# Patient Record
Sex: Female | Born: 1980 | Race: Black or African American | Marital: Married | Smoking: Current some day smoker
Health system: Southern US, Community
[De-identification: ages and names within clinical notes are randomized; demographics above are authoritative.]

## PROBLEM LIST (undated history)

## (undated) DIAGNOSIS — J45909 Unspecified asthma, uncomplicated: Secondary | ICD-10-CM

## (undated) DIAGNOSIS — E119 Type 2 diabetes mellitus without complications: Secondary | ICD-10-CM

## (undated) DIAGNOSIS — D649 Anemia, unspecified: Secondary | ICD-10-CM

## (undated) DIAGNOSIS — E7800 Pure hypercholesterolemia, unspecified: Secondary | ICD-10-CM

## (undated) DIAGNOSIS — I10 Essential (primary) hypertension: Secondary | ICD-10-CM

## (undated) HISTORY — DX: Type 2 diabetes mellitus without complications: E11.9

## (undated) HISTORY — DX: Anemia, unspecified: D64.9

## (undated) HISTORY — DX: Unspecified asthma, uncomplicated: J45.909

## (undated) HISTORY — DX: Essential (primary) hypertension: I10

## (undated) HISTORY — DX: Pure hypercholesterolemia, unspecified: E78.00

---

## 2018-10-05 ENCOUNTER — Ambulatory Visit: Payer: Medicaid Other | Admitting: Obstetrics and Gynecology

## 2018-10-18 ENCOUNTER — Encounter: Payer: Self-pay | Admitting: Obstetrics and Gynecology

## 2018-11-18 ENCOUNTER — Other Ambulatory Visit: Payer: Self-pay | Admitting: Physician Assistant

## 2018-11-18 ENCOUNTER — Ambulatory Visit
Admission: RE | Admit: 2018-11-18 | Discharge: 2018-11-18 | Disposition: A | Discharge status: Home / Self Care | Payer: Medicaid Other | Source: Ambulatory Visit | Attending: Physician Assistant | Admitting: Physician Assistant

## 2018-11-18 DIAGNOSIS — M25561 Pain in right knee: Secondary | ICD-10-CM

## 2018-11-18 DIAGNOSIS — M25562 Pain in left knee: Secondary | ICD-10-CM

## 2018-11-18 DIAGNOSIS — M542 Cervicalgia: Secondary | ICD-10-CM

## 2018-12-28 ENCOUNTER — Encounter: Payer: Self-pay | Admitting: Advanced Practice Midwife

## 2018-12-28 ENCOUNTER — Ambulatory Visit: Payer: Medicaid Other | Admitting: Advanced Practice Midwife

## 2018-12-28 ENCOUNTER — Other Ambulatory Visit (HOSPITAL_COMMUNITY)
Admission: RE | Admit: 2018-12-28 | Discharge: 2018-12-28 | Disposition: A | Discharge status: Home / Self Care | Payer: Medicaid Other | Source: Ambulatory Visit | Attending: Advanced Practice Midwife | Admitting: Advanced Practice Midwife

## 2018-12-28 VITALS — BP 126/75 | HR 102 | Ht 66.0 in | Wt 217.0 lb

## 2018-12-28 DIAGNOSIS — N939 Abnormal uterine and vaginal bleeding, unspecified: Secondary | ICD-10-CM

## 2018-12-28 DIAGNOSIS — N96 Recurrent pregnancy loss: Secondary | ICD-10-CM | POA: Diagnosis not present

## 2018-12-28 DIAGNOSIS — R102 Pelvic and perineal pain unspecified side: Secondary | ICD-10-CM

## 2018-12-28 DIAGNOSIS — Z01419 Encounter for gynecological examination (general) (routine) without abnormal findings: Secondary | ICD-10-CM | POA: Diagnosis not present

## 2018-12-28 DIAGNOSIS — N941 Unspecified dyspareunia: Secondary | ICD-10-CM

## 2018-12-28 DIAGNOSIS — Z8759 Personal history of other complications of pregnancy, childbirth and the puerperium: Secondary | ICD-10-CM

## 2018-12-28 MED ORDER — MEGESTROL ACETATE 40 MG PO TABS: 40.0000 mg | ORAL_TABLET | Freq: Two times a day (BID) | ORAL | 3 refills | Status: AC

## 2018-12-28 NOTE — Patient Instructions (Signed)
Abnormal Uterine Bleeding  Abnormal uterine bleeding is unusual bleeding from the uterus. It includes:   Bleeding or spotting between periods.   Bleeding after sex.   Bleeding that is heavier than normal.   Periods that last longer than usual.   Bleeding after menopause.  Abnormal uterine bleeding can affect women at various stages in life, including teenagers, women in their reproductive years, pregnant women, and women who have reached menopause. Common causes of abnormal uterine bleeding include:   Pregnancy.   Growths of tissue (polyps).   A noncancerous tumor in the uterus (fibroid).   Infection.   Cancer.   Hormonal imbalances.  Any type of abnormal bleeding should be evaluated by a health care provider. Many cases are minor and simple to treat, while others are more serious. Treatment will depend on the cause of the bleeding.  Follow these instructions at home:   Monitor your condition for any changes.   Do not use tampons, douche, or have sex if told by your health care provider.   Change your pads often.   Get regular exams that include pelvic exams and cervical cancer screening.   Keep all follow-up visits as told by your health care provider. This is important.  Contact a health care provider if:   Your bleeding lasts for more than one week.   You feel dizzy at times.   You feel nauseous or you vomit.  Get help right away if:   You pass out.   Your bleeding soaks through a pad every hour.   You have abdominal pain.   You have a fever.   You become sweaty or weak.   You pass large blood clots from your vagina.  Summary   Abnormal uterine bleeding is unusual bleeding from the uterus.   Any type of abnormal bleeding should be evaluated by a health care provider. Many cases are minor and simple to treat, while others are more serious.   Treatment will depend on the cause of the bleeding.  This information is not intended to replace advice given to you by your health care provider.  Make sure you discuss any questions you have with your health care provider.  Document Released: 10/13/2005 Document Revised: 11/14/2016 Document Reviewed: 11/14/2016  Elsevier Interactive Patient Education  2019 [employer]

## 2018-12-28 NOTE — Progress Notes (Signed)
  GYNECOLOGY PROGRESS NOTE  History:  38 y.o. G6P0060 with poor obstetric hx including ectopic x 3, SAB x 2 and stillbirth x 1 presents to [facility] office today for problem gyn visit. She reports onset of irregular heavy bleeding 3 years ago, worsening in recent months.  She also has dyspareunia with onset 3 years ago.  She has seen primary care and OB/Gyn but was unhappy with her care in another [place] and plans to stay here in [place].  She and her husband desire pregnancy.  She is not sure if she has ever had evaluation for frequent pregnancy loss.  She was recently told she was anemic and started on oral iron which she is taking as prescribed.  She reports the bleeding occurs irregularly every 1-4 months and lasts 2-3 weeks with large clots.  She has occasional dizziness and h/a and reports fatigue. She denies chest pain or shortness of breath.       The following portions of the patient's history were reviewed and updated as appropriate: allergies, current medications, past family history, past medical history, past social history, past surgical history and problem list. Pt is unsure about last pap smear date but reports it was normal.  Review of Systems:  Pertinent items are noted in HPI.   Objective:  Physical Exam Blood pressure 126/75, pulse (!) 102, height 5\' 6"  (1.676 m), weight 98.4 kg, last menstrual period 12/08/2018. VS reviewed, nursing note reviewed,  Constitutional: well developed, well nourished, no distress HEENT: normocephalic CV: normal rate Pulm/chest wall: normal effort VS reviewed, nursing note reviewed,  Constitutional: well developed, well nourished, no distress HEENT: normocephalic CV: normal rate Pulm/chest wall: normal effort Breast Exam:  right breast normal without mass, skin or nipple changes or axillary nodes, left breast normal without mass, skin or nipple changes or axillary nodes Abdomen: soft, tenderness bilaterally in mid abdomen/flank, no rebound  tenderness or guarding Neuro: alert and oriented x 3 Skin: warm, dry Psych: affect normal Pelvic exam: Cervix pink, visually closed, without lesion, small amount of dark red bleeding with small clots, no fox swab required to visualize cervix, vaginal walls and external genitalia normal Bimanual exam: Cervix 0/long/high, firm, anterior, neg CMT, uterus nontender, nonenlarged, adnexa with tenderness bilaterally, no enlargement or mass  Assessment & Plan:  1. Pelvic pain in female --Differentials include fibroids, endometriosis, adenomyosis, infection - Cervicovaginal ancillary only( [place])  2. Abnormal uterine bleeding (AUB) --No records to review, will repeat CBC for baseline today - CBC --Megace 40 mg BID x 1 month with 2 refills.    3. Encounter for well woman exam with routine gynecological exam - Cytology - PAP( [place])  4. History of multiple miscarriages --Poor obstetric hx with ectopics, SABs, and demise --Consult Dr Debroah Loop.  Labwork today, outpatient [place], and pt to see MD for follow up in 1-2 months. - Cardiolipin antibodies, IgG, IgM, IgA - Lupus anticoagulant panel( [facility]) - Von Willebrand panel   Sharen Counter, CNM 12:45 PM

## 2018-12-30 LAB — CBC
Hematocrit: 35.2 % (ref 34.0–46.6)
Hemoglobin: 10.2 g/dL — ABNORMAL LOW (ref 11.1–15.9)
MCH: 18.4 pg — AB (ref 26.6–33.0)
MCHC: 29 g/dL — AB (ref 31.5–35.7)
MCV: 64 fL — ABNORMAL LOW (ref 79–97)
NRBC: 1 % — AB (ref 0–0)
Platelets: 345 10*3/uL (ref 150–450)
RBC: 5.53 x10E6/uL — AB (ref 3.77–5.28)
RDW: 24 % — ABNORMAL HIGH (ref 11.7–15.4)
WBC: 9.8 10*3/uL (ref 3.4–10.8)

## 2018-12-30 LAB — VON WILLEBRAND PANEL
Factor VIII Activity: 251 % — ABNORMAL HIGH (ref 56–140)
Von Willebrand Ag: 315 % — ABNORMAL HIGH (ref 50–200)
Von Willebrand Factor: 268 % — ABNORMAL HIGH (ref 50–200)

## 2018-12-30 LAB — CARDIOLIPIN ANTIBODIES, IGG, IGM, IGA
Anticardiolipin IgA: <9 APL U/mL (ref 0–11)
Anticardiolipin IgG: <9 GPL U/mL (ref 0–14)
Anticardiolipin IgM: <9 MPL U/mL (ref 0–12)

## 2018-12-30 LAB — COAG STUDIES INTERP REPORT

## 2018-12-30 LAB — LUPUS ANTICOAGULANT PANEL
Dilute Viper Venom Time: 29.8 s (ref 0.0–47.0)
PTT Lupus Anticoagulant: 35.8 s (ref 0.0–51.9)

## 2018-12-30 LAB — CYTOLOGY - PAP
Diagnosis: NEGATIVE
HPV: NOT DETECTED

## 2018-12-30 LAB — CERVICOVAGINAL ANCILLARY ONLY
CHLAMYDIA, DNA PROBE: NEGATIVE
NEISSERIA GONORRHEA: NEGATIVE

## 2019-01-11 ENCOUNTER — Other Ambulatory Visit: Payer: Self-pay

## 2019-01-11 ENCOUNTER — Ambulatory Visit (HOSPITAL_COMMUNITY)
Admission: RE | Admit: 2019-01-11 | Discharge: 2019-01-11 | Disposition: A | Discharge status: Home / Self Care | Payer: Medicaid Other | Source: Ambulatory Visit | Attending: Advanced Practice Midwife | Admitting: Advanced Practice Midwife

## 2019-01-11 DIAGNOSIS — N939 Abnormal uterine and vaginal bleeding, unspecified: Secondary | ICD-10-CM | POA: Insufficient documentation

## 2019-01-11 DIAGNOSIS — R102 Pelvic and perineal pain: Secondary | ICD-10-CM | POA: Insufficient documentation

## 2019-01-25 ENCOUNTER — Encounter: Payer: Self-pay | Admitting: Obstetrics and Gynecology

## 2019-01-25 ENCOUNTER — Ambulatory Visit (INDEPENDENT_AMBULATORY_CARE_PROVIDER_SITE_OTHER): Payer: Medicaid Other | Admitting: Obstetrics and Gynecology

## 2019-01-25 ENCOUNTER — Other Ambulatory Visit: Payer: Self-pay

## 2019-01-25 DIAGNOSIS — N96 Recurrent pregnancy loss: Secondary | ICD-10-CM

## 2019-01-25 DIAGNOSIS — Z712 Person consulting for explanation of examination or test findings: Secondary | ICD-10-CM | POA: Diagnosis not present

## 2019-01-25 NOTE — Progress Notes (Signed)
Pt states that bleeding is better, she is still having pelvic pain-sharp. Pt states pain is somewhat better.

## 2019-01-25 NOTE — Progress Notes (Signed)
TELEHEALTH VIRTUAL GYNECOLOGY VISIT ENCOUNTER NOTE  I connected with Courtney Stuart on 01/25/19 at  1:30 PM EDT by telephone at home and verified that I am speaking with the correct person using two identifiers.   I discussed the limitations, risks, security and privacy concerns of performing an evaluation and management service by telephone and the availability of in person appointments. I also discussed with the patient that there may be a patient responsible charge related to this service. The patient expressed understanding and agreed to proceed.   History:  Secret Cradle is a 38 y.o. G45P0060 female being evaluated today to discuss results of pelvic ultrasound performed to evaluate DUB. Patient was also treated with megace and reports significant improvement in her cycle. She is still taking the megace. She denies any abnormal vaginal discharge, bleeding, pelvic pain or other concerns.  She is still interested in conceiving a child with her husband who has 9 children of his own.     Past Medical History:  Diagnosis Date  . Anemia   . Asthma   . Diabetes mellitus without complication (HCC)   . High cholesterol   . Hypertension    History reviewed. No pertinent surgical history. The following portions of the patient's history were reviewed and updated as appropriate: allergies, current medications, past family history, past medical history, past social history, past surgical history and problem list.   Health Maintenance:  Normal pap and negative HRHPV on 12/28/2018.    Review of Systems:  Pertinent items noted in HPI and remainder of comprehensive ROS otherwise negative.  Physical Exam:  Physical exam deferred due to nature of the encounter  Labs and Imaging No results found for this or any previous visit (from the past 336 hour(s)). US Pelvic Complete With Transvaginal  Result Date: 01/11/2019 CLINICAL DATA:  Abnormal uterine bleeding, pelvic pain in a female, history of  endometriosis EXAM: TRANSABDOMINAL AND TRANSVAGINAL ULTRASOUND OF PELVIS TECHNIQUE: Both transabdominal and transvaginal ultrasound examinations of the pelvis were performed. Transabdominal technique was performed for global imaging of the pelvis including uterus, ovaries, adnexal regions, and pelvic cul-de-sac. It was necessary to proceed with endovaginal exam following the transabdominal exam to visualize the endometrium and ovaries. COMPARISON:  None FINDINGS: Uterus Measurements: 8.6 x 4.2 x 4.6 cm = volume: 88 mL. Mildly heterogeneous myometrial echogenicity. Small anterior wall subserosal leiomyoma 10 x 10 x 11 mm. Multiple nabothian cysts at cervix. Endometrium Thickness: 14 mm.  No endometrial fluid or focal abnormality Right ovary Measurements: 4.1 x 2.9 x 2.9 cm = volume: 17.5 mL. Normal morphology without mass Left ovary Measurements: 2.2 x 1.6 x 2.1 cm = volume: 3.9 mL. Normal morphology without mass Other findings No free pelvic fluid.  No adnexal masses. IMPRESSION: Small anterior wall subserosal leiomyoma 11 mm diameter. Otherwise normal exam. Electronically Signed   By: Ulyses Southward M.D.   On: 01/11/2019 10:59      Assessment and Plan:     38 yo with recent history of DUB.     Patient admits to being under a lot of stress these past few months and attributes her DUB to that. She is now settling into her new life in [place] and is hopeful that all will return to normal Advised patient to start taking prenatal vitamins Will refer patient to genetic counseling given history of 2 recent miscarriage with her husband Patient plans to discontinue megace next week  I discussed the assessment and treatment plan with the patient. The patient  was provided an opportunity to ask questions and all were answered. The patient agreed with the plan and demonstrated an understanding of the instructions.   The patient was advised to call back or seek an in-person evaluation/go to the ED if the symptoms  worsen or if the condition fails to improve as anticipated.  I provided 15 minutes of non-face-to-face time during this encounter.   Catalina Antigua, MD [facility] [employer], [facility]

## 2019-02-14 ENCOUNTER — Ambulatory Visit (HOSPITAL_COMMUNITY): Payer: Self-pay | Admitting: Obstetrics and Gynecology

## 2019-02-14 ENCOUNTER — Ambulatory Visit (HOSPITAL_COMMUNITY): Payer: Medicaid Other

## 2019-02-14 ENCOUNTER — Ambulatory Visit (HOSPITAL_COMMUNITY): Payer: Medicaid Other | Attending: Obstetrics and Gynecology

## 2019-02-14 ENCOUNTER — Other Ambulatory Visit: Payer: Self-pay

## 2019-07-14 ENCOUNTER — Other Ambulatory Visit: Payer: Self-pay | Admitting: Adult Medicine

## 2019-07-14 ENCOUNTER — Other Ambulatory Visit: Payer: Self-pay

## 2019-07-14 DIAGNOSIS — M545 Low back pain, unspecified: Secondary | ICD-10-CM

## 2019-07-14 DIAGNOSIS — M546 Pain in thoracic spine: Secondary | ICD-10-CM

## 2019-07-14 DIAGNOSIS — G8929 Other chronic pain: Secondary | ICD-10-CM

## 2020-11-12 IMAGING — CR DG LUMBAR SPINE COMPLETE 4+V
5 series · 5 of 5 positions shown · non-contrast
Comparison: None.

CLINICAL DATA: Lumbago

EXAM:
LUMBAR SPINE - COMPLETE 4+ VIEW

[w lumbar spine ap]
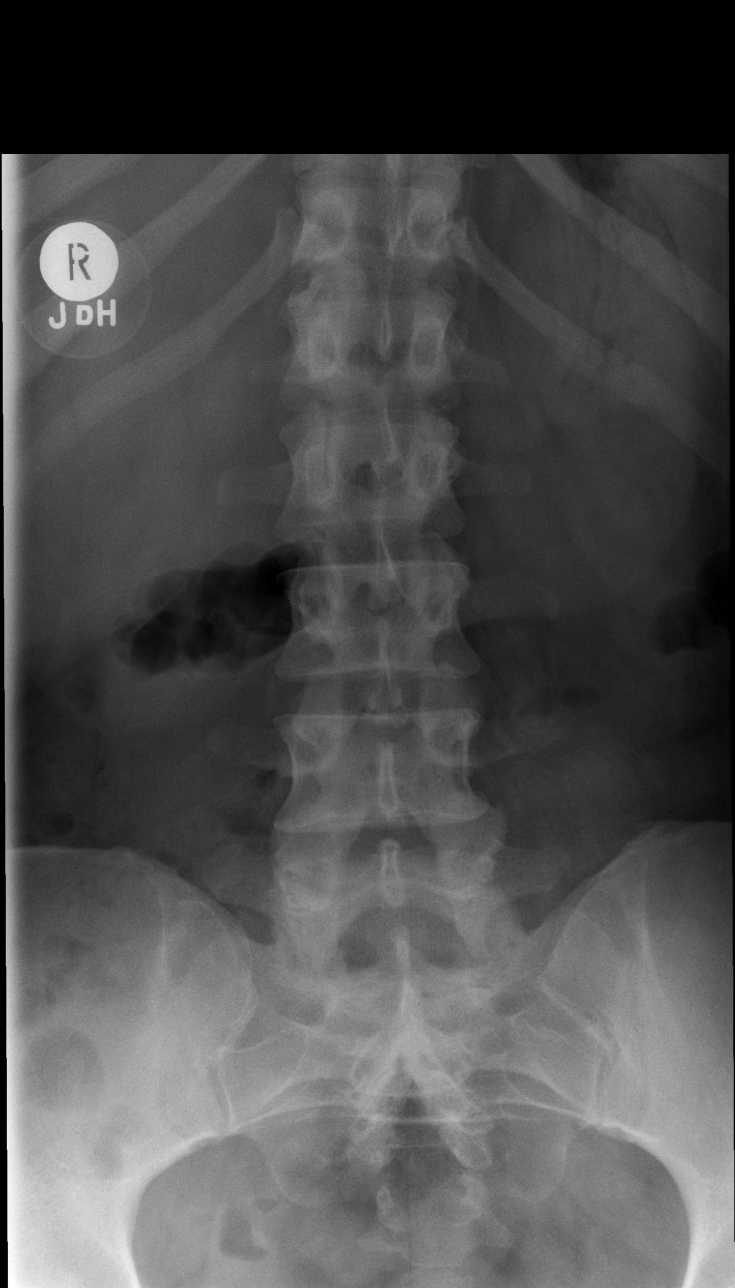

[w lumbar spine obl (1 of 2)]
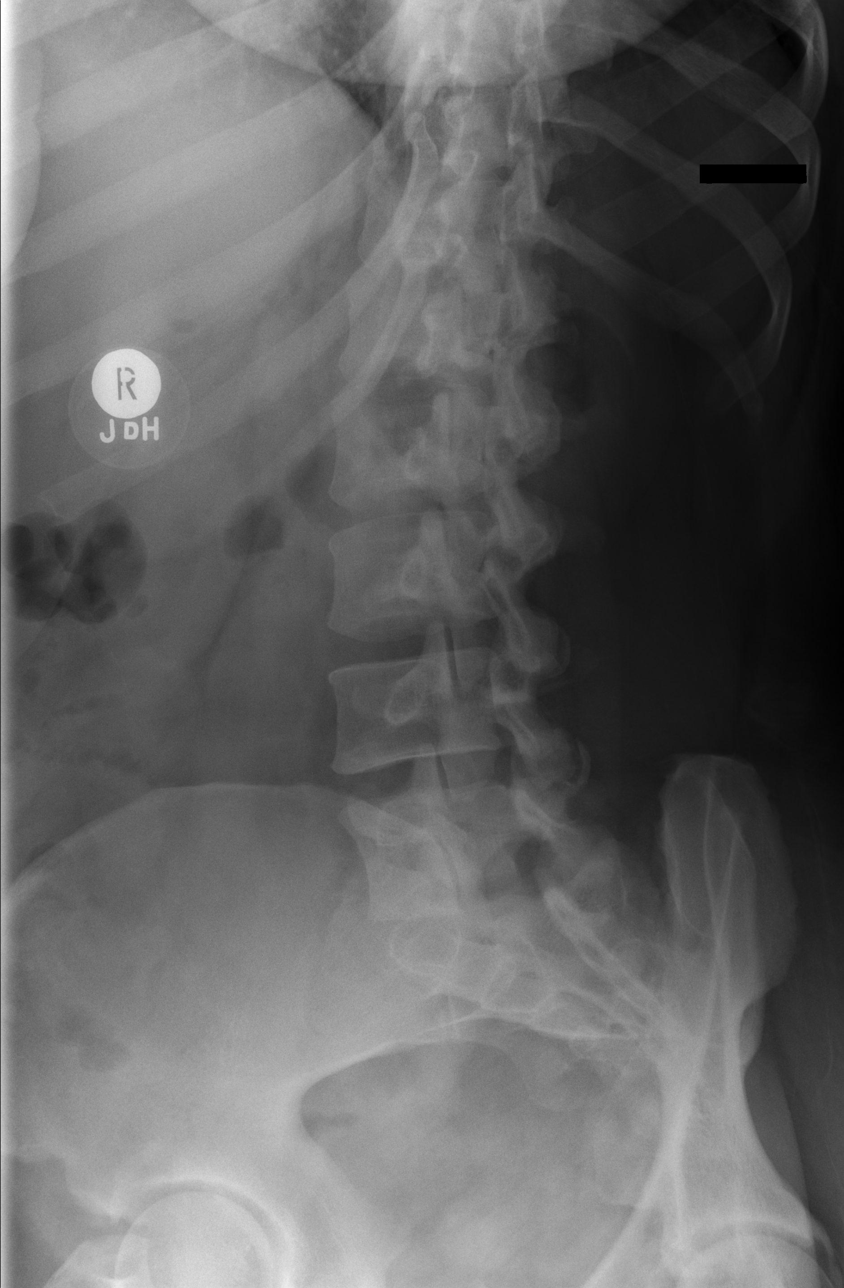

[w lumbar spine obl (2 of 2)]
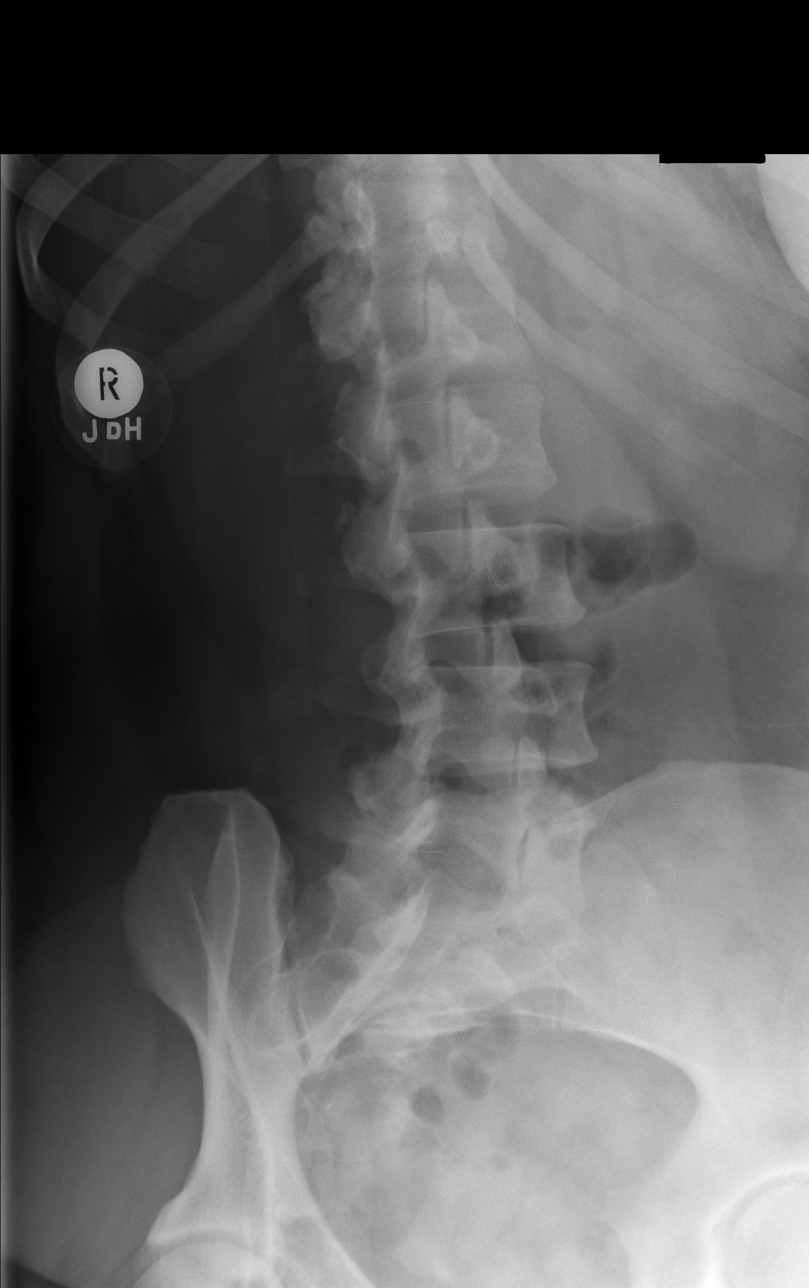

[w lumbar spine lat]
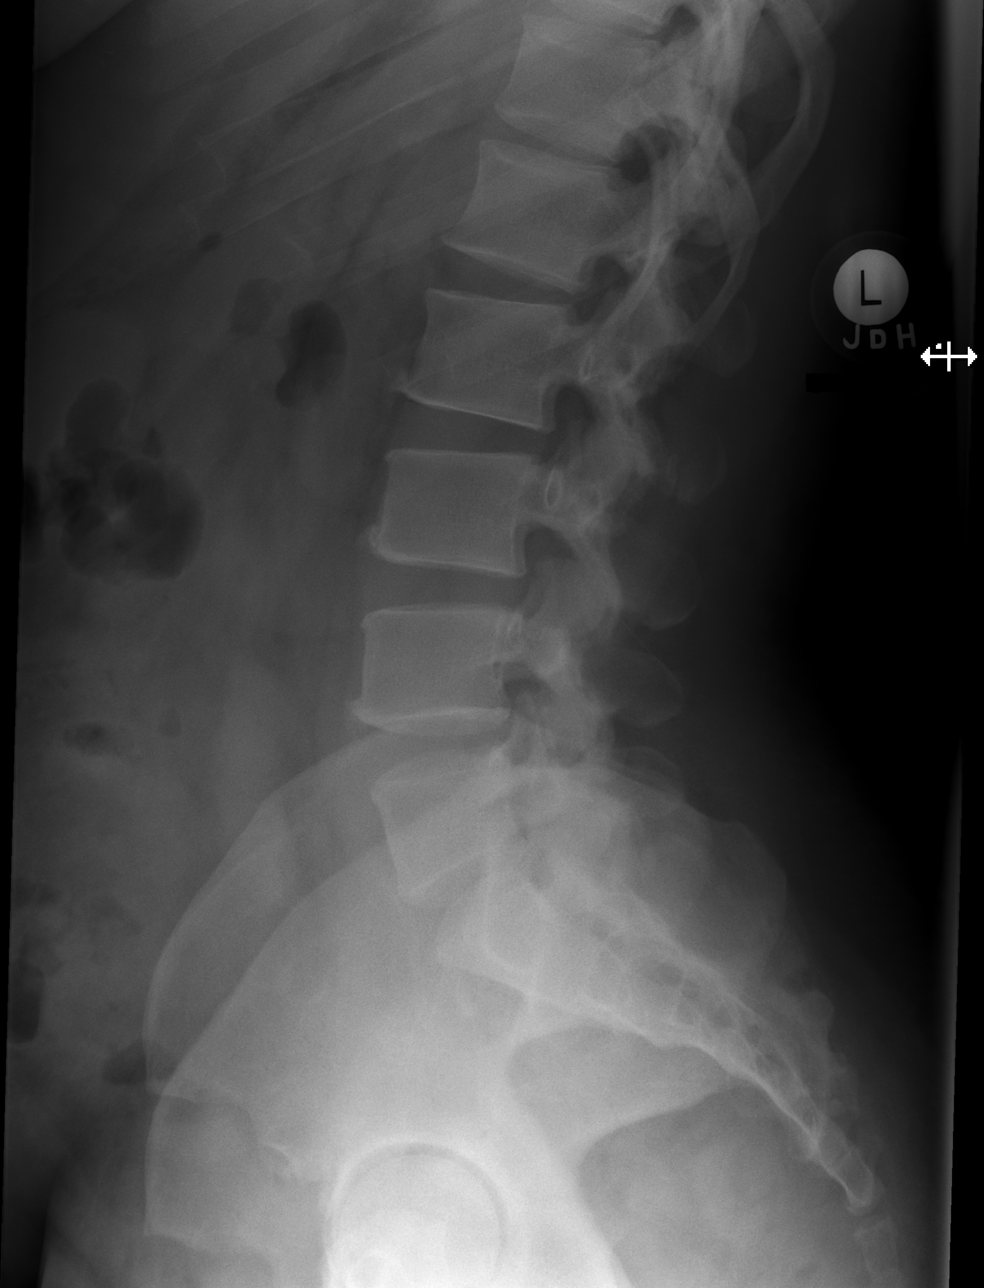

[w lumbar l-5 s-1 spot]
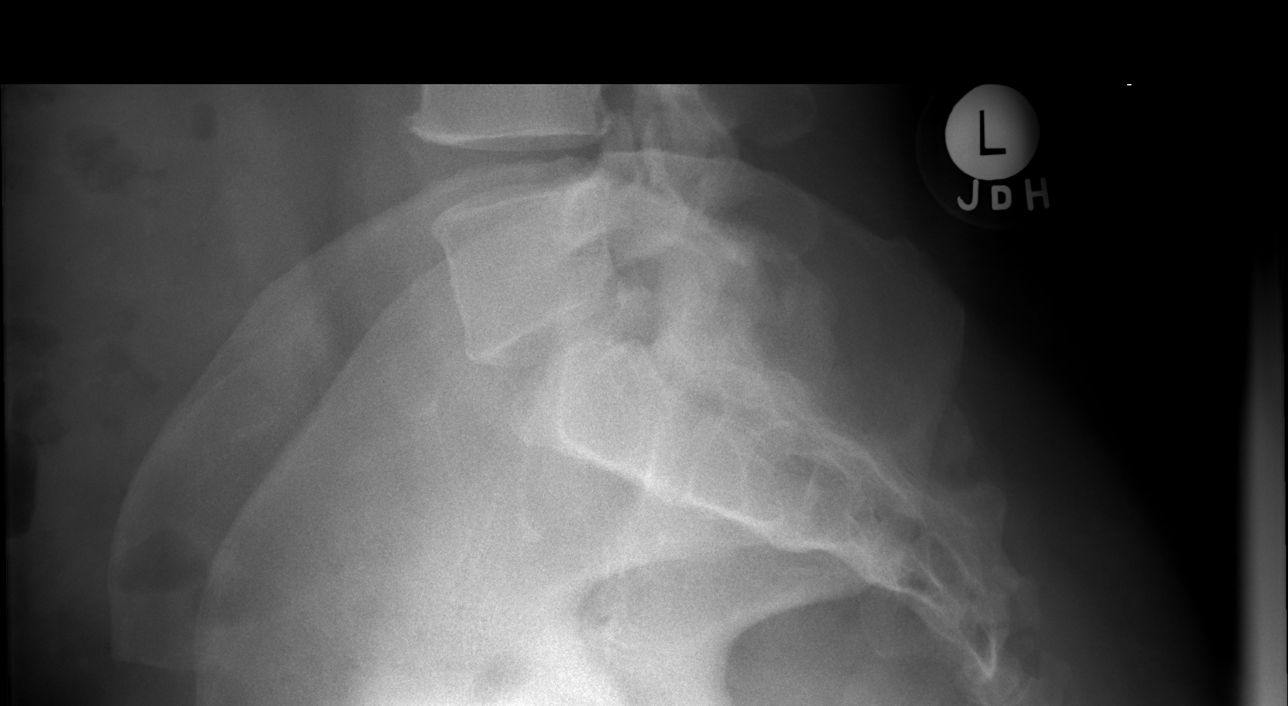

[5 of 5 positions shown; findings below may reference images not displayed]

FINDINGS: Standing frontal, standing lateral, standing spot lumbosacral
lateral, and standing bilateral oblique views were obtained. There
are 5 non-rib-bearing lumbar type vertebral bodies. There is no
fracture or spondylolisthesis. The disc spaces appear unremarkable.
There is no appreciable facet arthropathy.
IMPRESSION: No fracture or spondylolisthesis.  No appreciable arthropathy.

## 2020-11-12 IMAGING — CR DG CERVICAL SPINE 2 OR 3 VIEWS
2 series · 2 of 2 positions shown · non-contrast
Comparison: None.

CLINICAL DATA: Cervicalgia

EXAM:
CERVICAL SPINE - 2-3 VIEW

[w cervical spine lat]
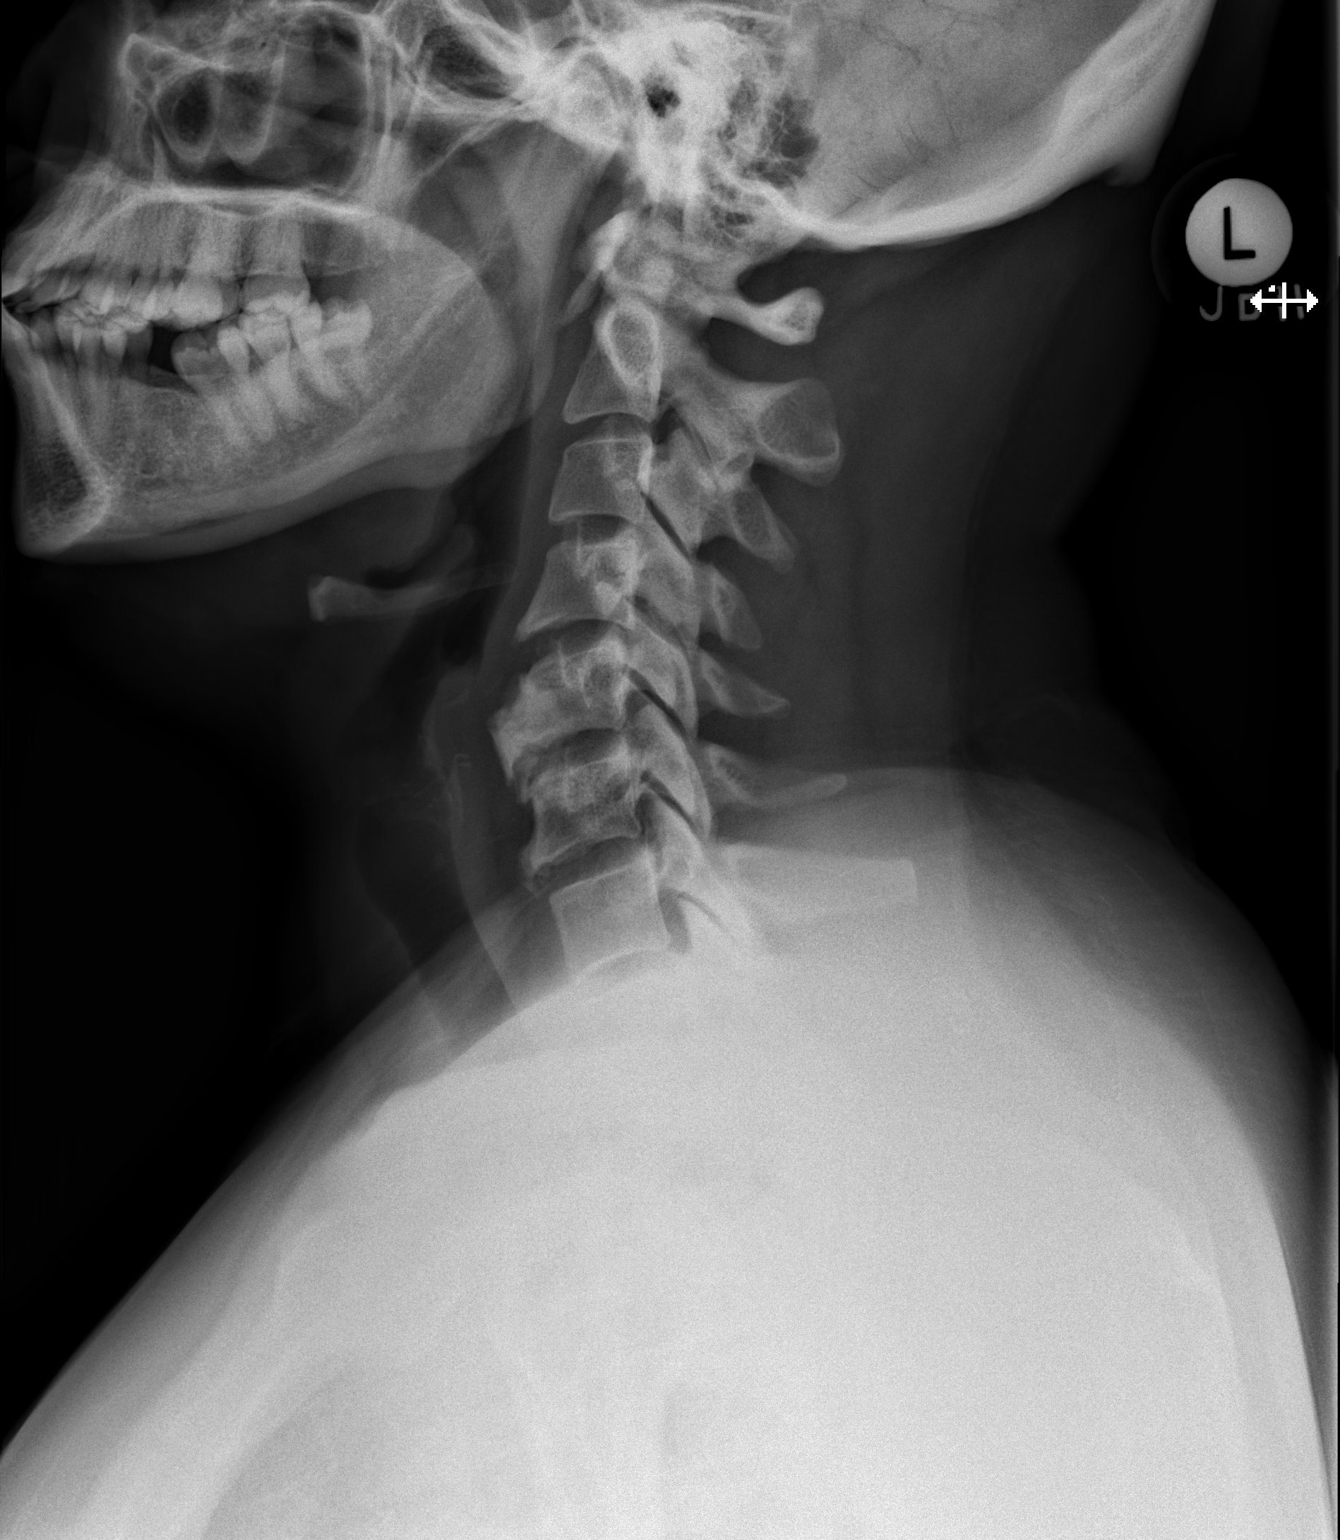

[w cervical spine ap]
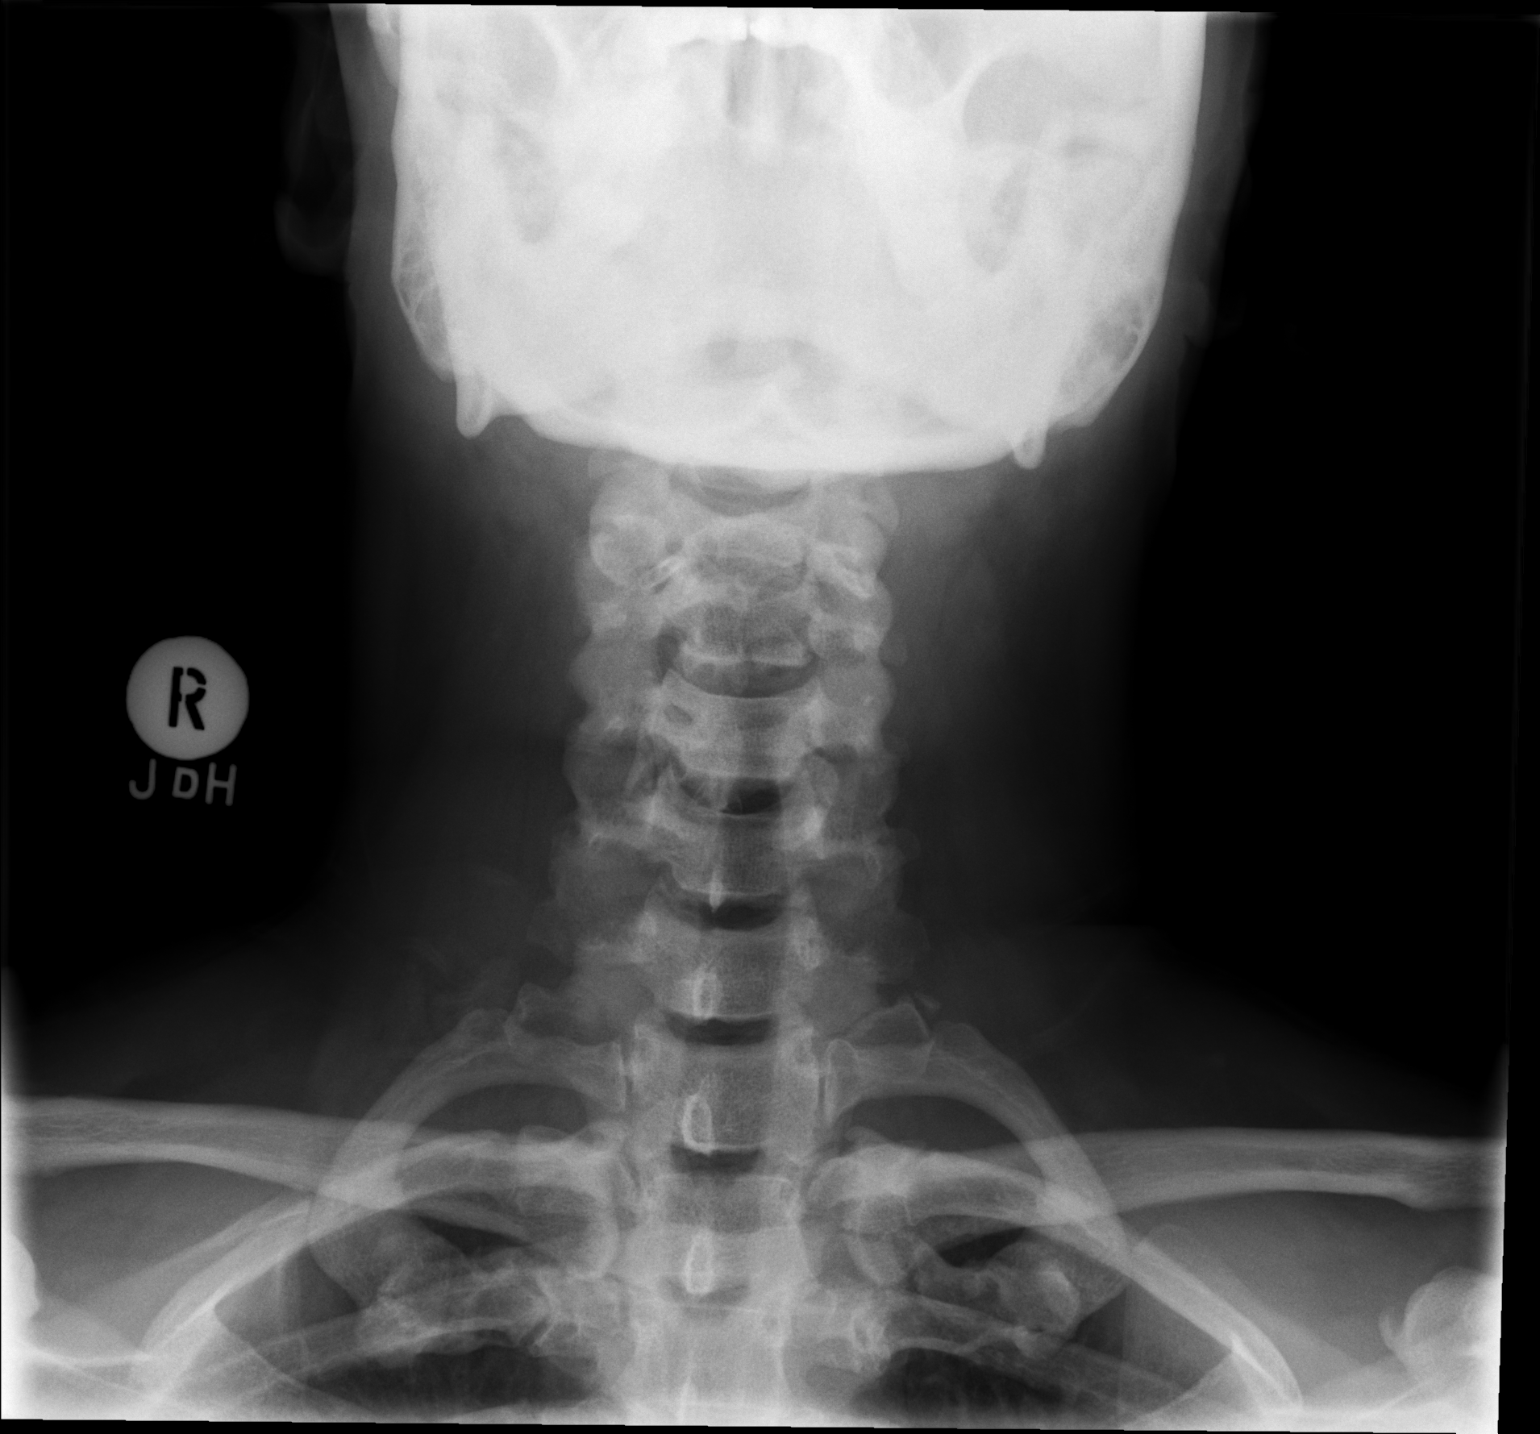

[2 of 2 positions shown; findings below may reference images not displayed]

FINDINGS: Frontal and lateral views were obtained. No fracture or
spondylolisthesis. Prevertebral soft tissues and predental space
regions are normal. There is slight disc space narrowing at C 6 7.
There are prominent anterior osteophytes at C4, C5, and C6 with
calcification in the anterior ligament at C5-6 and C6-7. There is
moderate bony overgrowth along the anterior aspect of C5. No erosive
changes. Lung apices are clear.
IMPRESSION: Mild lower cervical osteoarthritic change. Areas of calcification in
the anterior ligament at C5-6 and C6-7. Bony overgrowth and the
anterior aspect of the C5 vertebral body raises question of old
trauma in this area. No acute fracture or spondylolisthesis evident.
